# Patient Record
Sex: Female | Born: 1980 | Hispanic: Yes | Marital: Married | Smoking: Never smoker
Health system: Southern US, Community
[De-identification: ages and names within clinical notes are randomized; demographics above are authoritative.]

---

## 2006-07-14 ENCOUNTER — Inpatient Hospital Stay (HOSPITAL_COMMUNITY): Admission: AD | Admit: 2006-07-14 | Discharge: 2006-07-14 | Payer: Self-pay | Admitting: Obstetrics

## 2006-09-20 ENCOUNTER — Inpatient Hospital Stay (HOSPITAL_COMMUNITY): Admission: AD | Admit: 2006-09-20 | Discharge: 2006-09-23 | Payer: Self-pay | Admitting: Obstetrics

## 2008-02-21 IMAGING — US US OB LIMITED
1 series · 14 of 15 positions shown · non-contrast
Comparison: none

OBSTETRICAL ULTRASOUND:

 This ultrasound exam was performed in the [HOSPITAL] Ultrasound Department.  The OB US report was generated in the AS system, and faxed to the ordering physician.  This report is also available in [REDACTED] PACS.

[Series 1: us ob limited · 0.33mm/px · 14 of 15 slices shown]
[im 1/15]
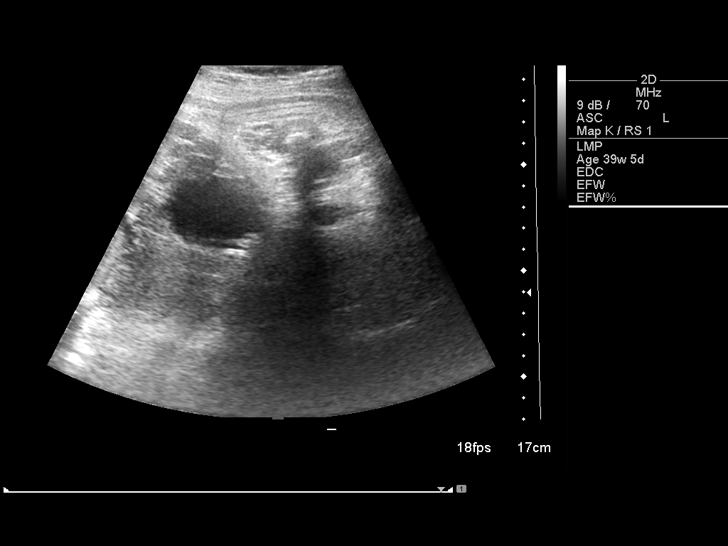
[im 2/15]
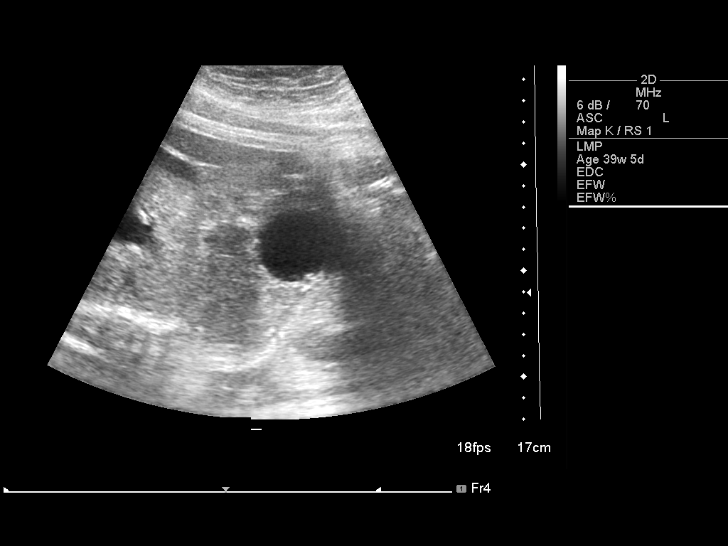
[im 3/15]
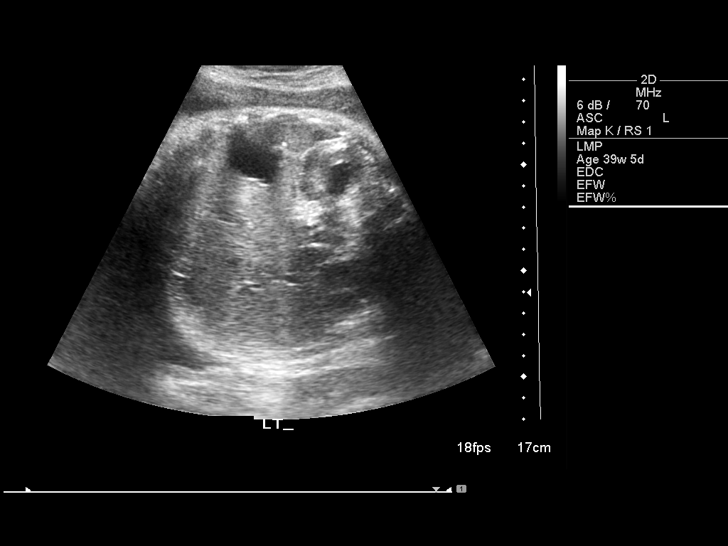
[im 4/15]
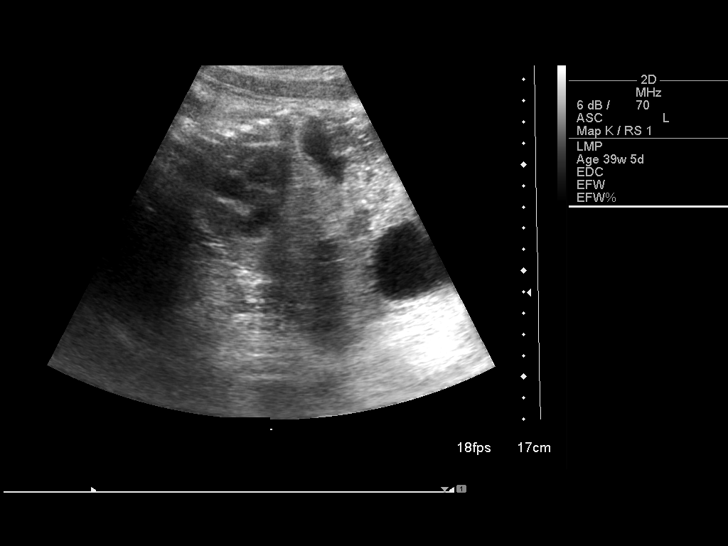
[im 5/15]
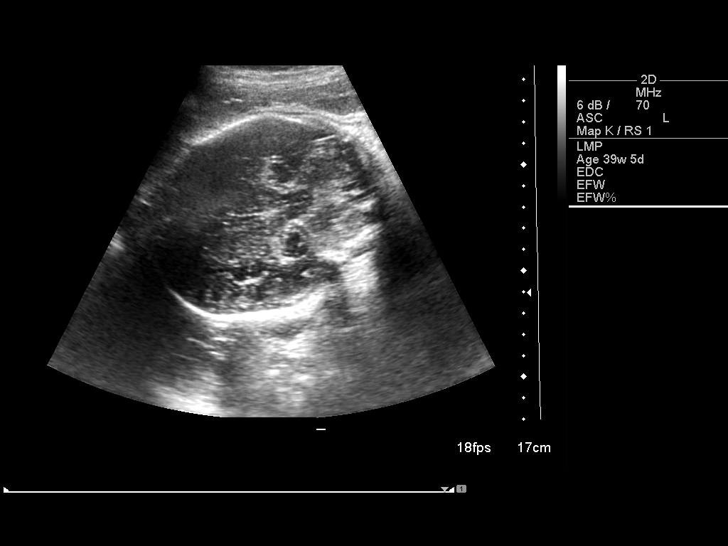
[im 6/15]
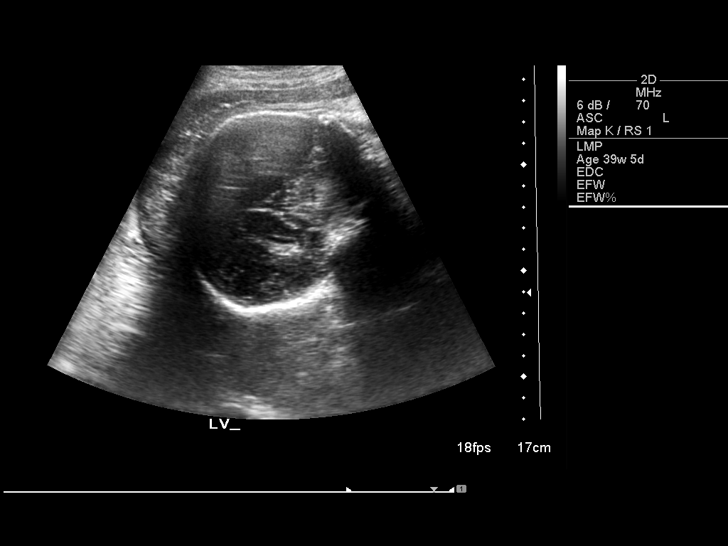
[im 7/15]
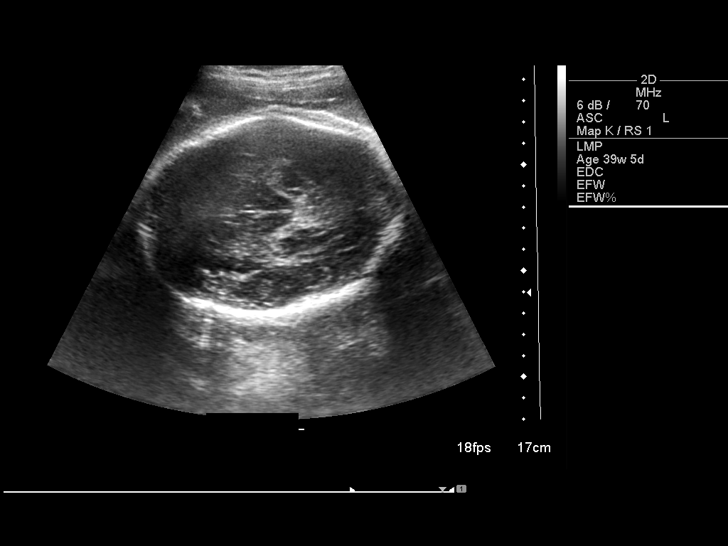
[im 9/15]
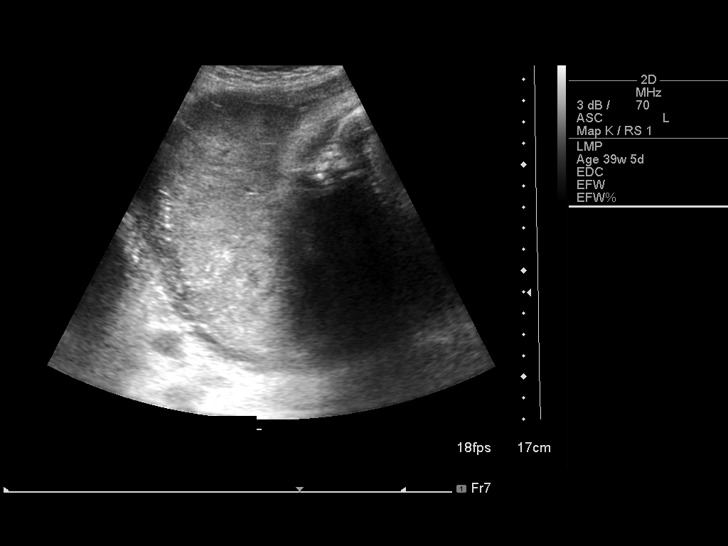
[im 10/15]
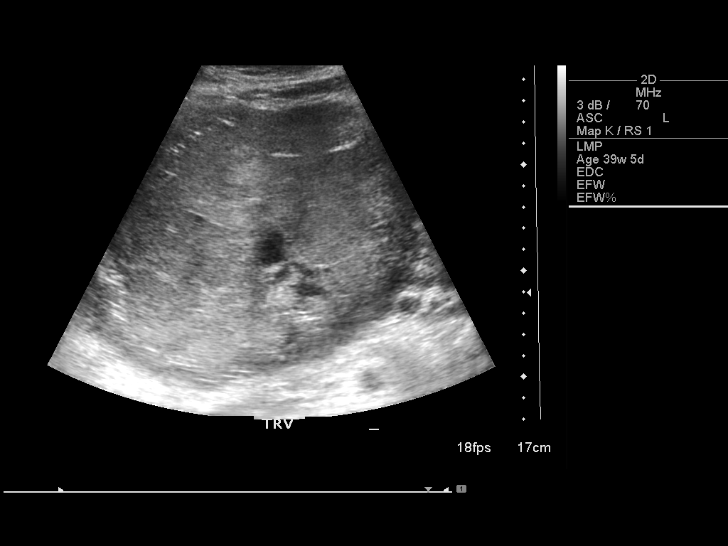
[im 11/15]
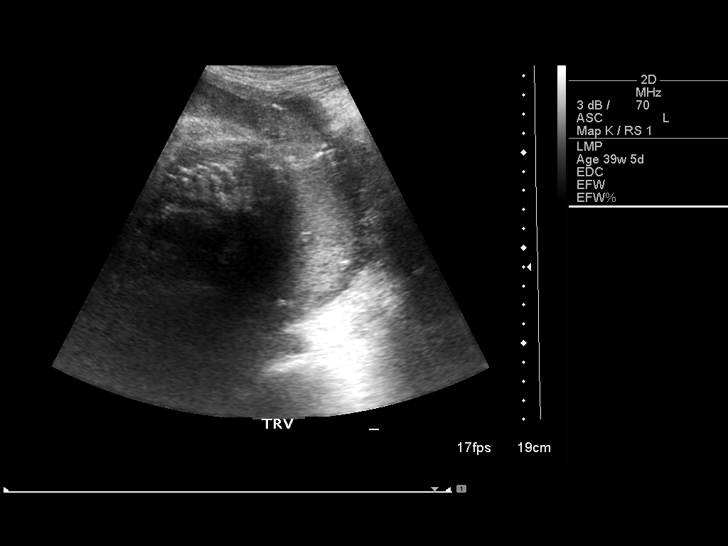
[im 12/15]
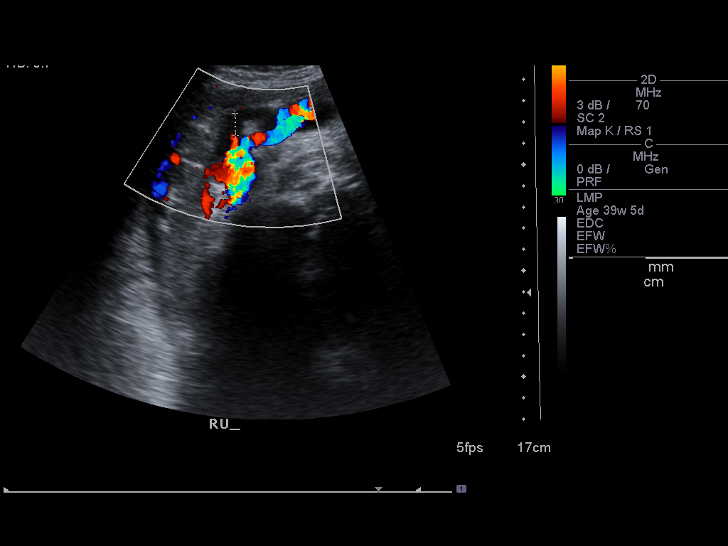
[im 13/15]
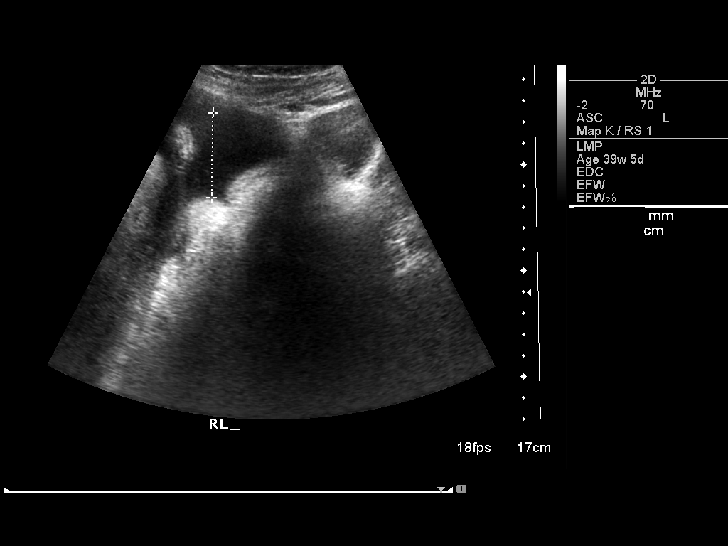
[im 14/15]
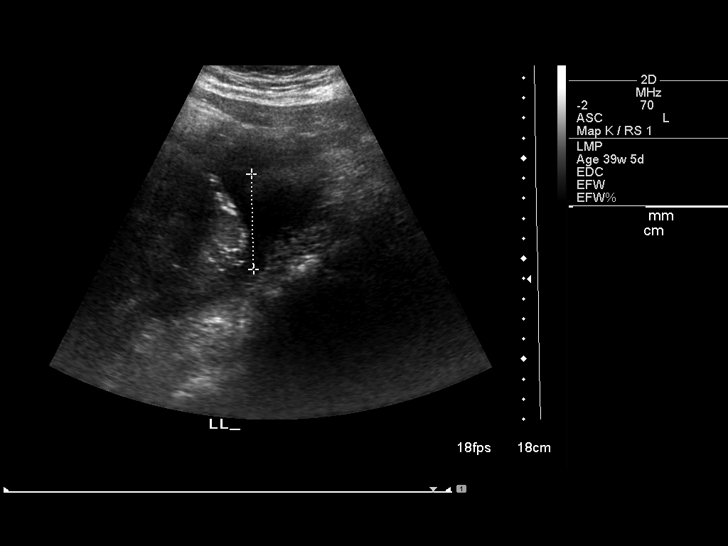
[im 15/15]
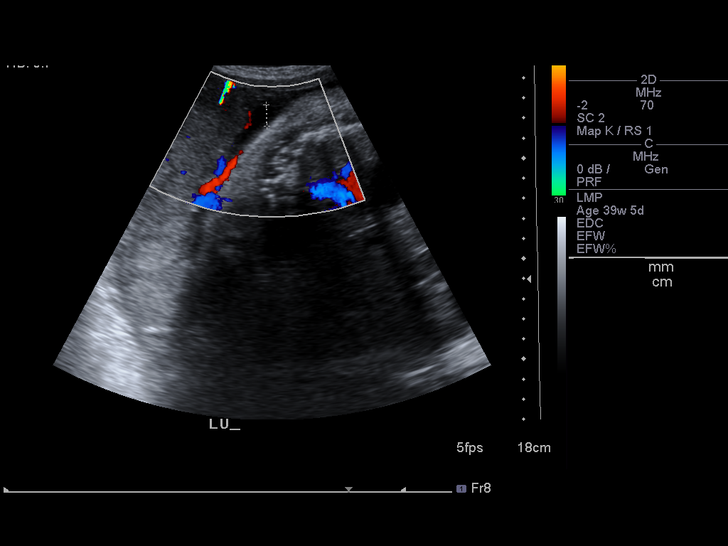

[14 of 15 positions shown; findings below may reference images not displayed]

IMPRESSION: See AS Obstetric US report.

## 2008-09-11 ENCOUNTER — Encounter: Payer: Self-pay | Admitting: Family Medicine

## 2008-09-11 ENCOUNTER — Ambulatory Visit (HOSPITAL_COMMUNITY): Admission: RE | Admit: 2008-09-11 | Discharge: 2008-09-11 | Payer: Self-pay | Admitting: Family Medicine

## 2008-12-23 ENCOUNTER — Ambulatory Visit: Payer: Self-pay | Admitting: Family Medicine

## 2008-12-28 ENCOUNTER — Inpatient Hospital Stay (HOSPITAL_COMMUNITY): Admission: AD | Admit: 2008-12-28 | Discharge: 2008-12-30 | Payer: Self-pay | Admitting: Obstetrics and Gynecology

## 2008-12-28 ENCOUNTER — Ambulatory Visit: Payer: Self-pay | Admitting: Family Medicine

## 2010-03-06 ENCOUNTER — Inpatient Hospital Stay (HOSPITAL_COMMUNITY): Payer: Self-pay

## 2010-03-06 ENCOUNTER — Inpatient Hospital Stay (HOSPITAL_COMMUNITY)
Admission: AD | Admit: 2010-03-06 | Discharge: 2010-03-06 | Disposition: A | Discharge status: Home / Self Care | Payer: Self-pay | Source: Ambulatory Visit | Attending: Obstetrics and Gynecology | Admitting: Obstetrics and Gynecology

## 2010-03-06 DIAGNOSIS — O209 Hemorrhage in early pregnancy, unspecified: Secondary | ICD-10-CM | POA: Insufficient documentation

## 2010-03-06 LAB — CBC
Hemoglobin: 13.8 g/dL (ref 12.0–15.0)
MCHC: 34.3 g/dL (ref 30.0–36.0)
RDW: 12.8 % (ref 11.5–15.5)

## 2010-03-06 LAB — URINALYSIS, ROUTINE W REFLEX MICROSCOPIC
Glucose, UA: NEGATIVE mg/dL
Ketones, ur: NEGATIVE mg/dL
Leukocytes, UA: NEGATIVE
pH: 7.5 (ref 5.0–8.0)

## 2010-03-06 LAB — URINE MICROSCOPIC-ADD ON

## 2010-03-06 LAB — WET PREP, GENITAL
Trich, Wet Prep: NONE SEEN
Yeast Wet Prep HPF POC: NONE SEEN

## 2010-03-07 LAB — RH IMMUNE GLOBULIN WORKUP (NOT WOMEN'S HOSP)

## 2010-03-08 ENCOUNTER — Inpatient Hospital Stay (HOSPITAL_COMMUNITY)
Admission: AD | Admit: 2010-03-08 | Discharge: 2010-03-08 | Disposition: A | Discharge status: Home / Self Care | Payer: Self-pay | Source: Ambulatory Visit | Attending: Obstetrics & Gynecology | Admitting: Obstetrics & Gynecology

## 2010-03-08 DIAGNOSIS — O039 Complete or unspecified spontaneous abortion without complication: Secondary | ICD-10-CM

## 2010-03-16 ENCOUNTER — Other Ambulatory Visit: Payer: Self-pay | Admitting: Obstetrics and Gynecology

## 2010-03-16 ENCOUNTER — Encounter (INDEPENDENT_AMBULATORY_CARE_PROVIDER_SITE_OTHER): Payer: Self-pay | Admitting: Obstetrics and Gynecology

## 2010-03-16 ENCOUNTER — Encounter: Payer: Self-pay | Admitting: Obstetrics and Gynecology

## 2010-03-16 DIAGNOSIS — Z124 Encounter for screening for malignant neoplasm of cervix: Secondary | ICD-10-CM

## 2010-03-16 DIAGNOSIS — Z01419 Encounter for gynecological examination (general) (routine) without abnormal findings: Secondary | ICD-10-CM

## 2010-03-16 DIAGNOSIS — O039 Complete or unspecified spontaneous abortion without complication: Secondary | ICD-10-CM

## 2010-03-17 ENCOUNTER — Encounter: Payer: Self-pay | Admitting: Physician Assistant

## 2010-03-25 NOTE — Progress Notes (Signed)
NAME:  Lauren Wong, Lauren Wong NO.:  [id number]  MEDICAL RECORD NO.:  [id number]           PATIENT TYPE:  A  LOCATION:  [facility]                   FACILITY:  [facility]  PHYSICIAN:  Argentina Donovan, MD        DATE OF BIRTH:  [birth year]  DATE OF SERVICE:  03/16/2010                                 CLINIC NOTE  REASON FOR VISIT:  Followup of miscarriage from the MAU.  The patient's age is 30.  Temperature 98.4, pulse 77, blood pressure 105/65, weight 129.6 pounds.  ALLERGIES:  No known drug allergies.  Last Pap smear was 3 years ago.  CURRENT MEDICATIONS:  None.  PAST MEDICAL HISTORY:  The patient's LMP was February 11, 2010.  The patient started menstruating at age of 22.  The patient is not trying to get pregnant at this time.  The patient has never had an abnormal Pap smear.  FAMILY HISTORY:  No significant family history.  SOCIAL HISTORY:  Lives with her spouse and two children.  Her last child was born 1 year and 3 months ago.  She does not work.  She does not drink.  She does not use any drugs or tobacco.  REVIEW OF SYSTEMS:  Negative.  HISTORY OF PRESENT ILLNESS:  The patient was seen in the MAU on March 08, 2010, for follow up of a miscarriage.  Previously, she has been seen on March 06, 2010.  On March 06, 2010, her beta hCG was 11914.  On March 08, 2010, her beta hCG was 7480.  Today, she says that her bleeding has decreased and she denies cramping or pain.  She has not had sex since her visit to the MAU because they told her that she needed to wait 40 days.  The patient's last baby was 1 year and 3 months ago.  She is a G2, P2-0-1-2.  She does want more children, but she wants them in 2-3 years.  Before this pregnancy, she had been Yasmin, which she likes, but she was forgetting to take.  She sees the doctor in [place], who had been writing her prescriptions.  She had Depo once, but did not like it due to weight gain.  She has been told previously about the  IUD and is interested in having that placed.  Today, she does not want the Depo shot, but she does agree to taking birth control pills while waiting for the Mirena.  The patient understands that she has to take her birth control pills every day in order to prevent a pregnancy and that we recommend that she wait to try to get pregnant until her beta hCG is gone down to 0.  PHYSICAL EXAMINATION:  GENERAL/VITAL SIGNS:  The patient in general in no acute distress and vital signs are reviewed. GYNECOLOGIC:  External exam was completely normal with no lesions. Internal exam; vagina normal, no discharge seen.  Cervix was open with blood at the opening without any cervical discharge or friability.  A Pap smear was collected.  ASSESSMENT:  This is a 30 year old status post complete abortion who is here for follow up.  PLAN: 1. Beta hCG repeat today, would like to follow  this to 0.  The patient     will be inform if she needs to come back for repeat beta hCG. 2. Contraception, discussed pros and cons of Mirena versus Depo-     Provera versus pills.  The patient agrees to take pills today.  She     understands the risk of getting pregnant and understands that she     needs to be compliant with her pills until she is able to get the     Mirena.  The patient will follow up for Mirena insertion or as     needed.    ______________________________ Ellery Plunk, MD   ______________________________ Argentina Donovan, MD   RS/MEDQ  D:  03/16/2010  T:  03/17/2010  Job:  [id number]

## 2010-03-30 ENCOUNTER — Other Ambulatory Visit: Payer: Self-pay

## 2010-04-03 DEATH — deceased

## 2010-04-04 LAB — RH IMMUNE GLOB WKUP(>/=20WKS)(NOT WOMEN'S HOSP): Fetal Screen: NEGATIVE

## 2010-04-04 LAB — CBC
HCT: 40.4 % (ref 36.0–46.0)
Hemoglobin: 13.8 g/dL (ref 12.0–15.0)
MCHC: 34.2 g/dL (ref 30.0–36.0)
MCV: 96.3 fL (ref 78.0–100.0)
Platelets: 201 K/uL (ref 150–400)
RBC: 4.2 MIL/uL (ref 3.87–5.11)
RDW: 13.2 % (ref 11.5–15.5)
WBC: 7.9 K/uL (ref 4.0–10.5)

## 2010-05-05 ENCOUNTER — Ambulatory Visit: Payer: Self-pay | Admitting: Physician Assistant

## 2010-05-17 NOTE — Op Note (Signed)
Lauren Wong, RAMAKRISHNAN NO.:  [id number]   MEDICAL RECORD NO.:  [id number]          PATIENT TYPE:  INP   LOCATION:  9198                          [facility]ILITY[facility]  [facility]   PHYSICIAN:  Roseanna Rainbow, M.D.DATE OF BIRTH:  06/01/1980   DATE OF PROCEDURE:  09/20/2006  DATE OF DISCHARGE:                               OPERATIVE REPORT   PREOPERATIVE DIAGNOSIS:  Intrauterine pregnancy at term, complete breech  presentation.   POSTOPERATIVE DIAGNOSIS:  Intrauterine pregnancy at term, complete  breech presentation.   PROCEDURE:  Primary low-uterine flap elliptical cesarean delivery via  Pfannenstiel skin incision.   SURGEON:  Quinn Plowman, M.D.   ANESTHESIA:  Spinal.   ESTIMATED BLOOD LOSS:  800 mL.   URINE OUTPUT:  300 mL.   IV FLUIDS:  2800 mL   DESCRIPTION OF PROCEDURE:  The patient was taken to the operating room  with an IV running.  She was placed in the dorsal supine position with a  leftward tilt and prepped and draped in the usual sterile fashion.  After a time-out had been completed a Pfannenstiel skin incision was  then made with the scalpel and carried down to the underlying fascia  with the Bovie.  The fascia was nicked in the midline.  The fascial  incision was then extended bilaterally with curved Mayo scissors.   The superior aspect of the fascial incision was then tented up, and the  underlying rectus muscle was dissected off.  The inferior aspect of the  fascial incision was manipulated in a similar fashion.  The rectus  muscles were separated in the midline.  The parietal peritoneum was  entered bluntly.  This incision was then extended superiorly and  inferiorly with good visualization of the bladder.  An Alexis retractor  was then placed into the incision.  The vesicouterine peritoneum was  tented up and entered sharply.  This incision was then extended  bilaterally, and the bladder flap created bluntly.  The lower uterine  segment was then incised in a transverse fashion with the scalpel.  The  incision was extended bluntly.  A complete breech extraction was then  performed.  The head was delivered at atraumatically.  The oropharynx  was suctioned with bulb suction.  The cord was clamped and cut.  The  infant was handed off to the awaiting neonatologist.  She was delivered  of a live born female.  Apgars were 9 at one and five minutes  respectively.  The weight was 8 pounds 12 ounces.   The placenta was then removed.  The intrauterine cavity was evacuated of  any remaining amniotic fluid, and clots and debris with a moistened  laparotomy sponge.  The uterine incision was then reapproximated in a  running interlocking fashion using suture of #0 Monocryl.  A second  imbricating layer of the same suture was then placed.  Individual  bleeding points were sutured with figure-of-eight sutures of 2-0  Monocryl.  The paracolic gutters were then irrigated.  The  parietoperitoneum was then reapproximated in a running fashion using 2-0  Vicryl.  The fascia  was closed in a running fashion  using #0 Vicryl.  The skin was closed  with staples.  At the close of the procedure the instrument and pack  counts were said to be correct x2.  A gram of cephazolin had been given  at cord clamp.  The patient was taken to the PACU awake, and in stable  condition.      Roseanna Rainbow, M.D.  Electronically Signed     LAJ/MEDQ  D:  09/20/2006  T:  09/21/2006  Job:  [id number]

## 2010-05-20 NOTE — Discharge Summary (Signed)
NAMEKETINA, MARS NO.:  [id number]   MEDICAL RECORD NO.:  [id number]          PATIENT TYPE:  INP   LOCATION:  9103                          [facility]   PHYSICIAN:  Kathreen Cosier, M.D.DATE OF BIRTH:  [birth year]   DATE OF ADMISSION:  09/20/2006  DATE OF DISCHARGE:  09/23/2006                               DISCHARGE SUMMARY   The patient is a 30 year old gravida 1, EDC September 22 was admitted in  labor with a breech presentation, and she underwent primary low  transverse cesarean section for breech presentation.  Postoperatively,  she did well.  Her hemoglobin on admission was 11.5, postoperative 10.9,  platelets 199.  Sodium 135, potassium 4.3, chloride 106, CO2 25.  She  was discharged home on the third postoperative day to see me in 6 weeks.   DISCHARGE DIAGNOSIS:  Status post primary low transverse cesarean  section at term for breech presentation.           ______________________________  Kathreen Cosier, M.D.     BAM/MEDQ  D:  10/17/2006  T:  10/17/2006  Job:  [id number]

## 2010-10-13 LAB — RH IMMUNE GLOB WKUP(>/=20WKS)(NOT WOMEN'S HOSP)

## 2010-10-13 LAB — COMPREHENSIVE METABOLIC PANEL
ALT: 35
AST: 37
Albumin: 2.2 — ABNORMAL LOW
CO2: 25
Calcium: 9
GFR calc Af Amer: >60
GFR calc non Af Amer: >60
Sodium: 135

## 2010-10-13 LAB — CBC
HCT: 32.2 — ABNORMAL LOW
Hemoglobin: 10.9 — ABNORMAL LOW
Hemoglobin: 11.5 — ABNORMAL LOW
MCHC: 33.7
MCV: 85.5
RBC: 3.76 — ABNORMAL LOW
RBC: 3.94
RDW: 15 — ABNORMAL HIGH

## 2010-10-13 LAB — RPR: RPR Ser Ql: NONREACTIVE

## 2010-10-18 LAB — RH IMMUNE GLOBULIN WORKUP (NOT WOMEN'S HOSP): ABO/RH(D): O NEG

## 2011-08-07 IMAGING — US US OB TRANSVAGINAL
1 series · 14 of 28 positions shown · non-contrast
Comparison: None.

CLINICAL DATA: Positive pregnancy test with vaginal bleeding.
Gestational age by LMP is 7 weeks 5 days.

OBSTETRIC <14 WK US AND TRANSVAGINAL OB US
TECHNIQUE: Both transabdominal and transvaginal ultrasound
examinations were performed for complete evaluation of the
gestation as well as the maternal uterus, adnexal regions, and
pelvic cul-de-sac.  Transvaginal technique was performed to assess
early pregnancy.

[Series 1: us ob comp less 14 wks · 44 acquisitions, 14 frames shown]
[im 2/44]
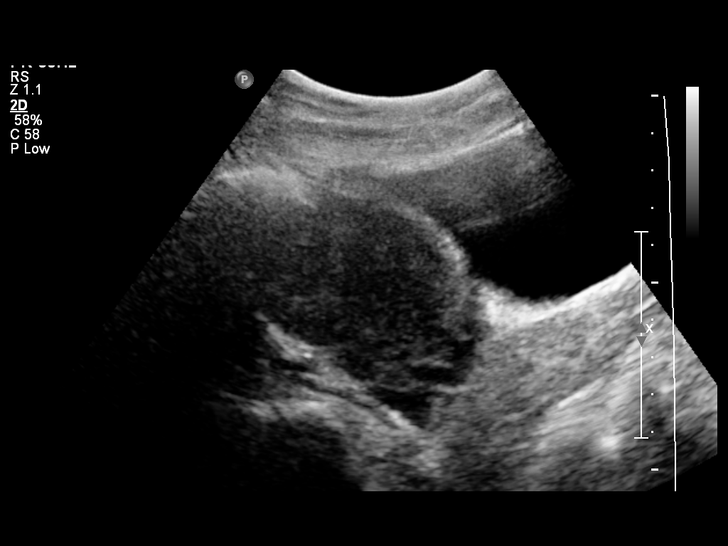
[im 5/44]
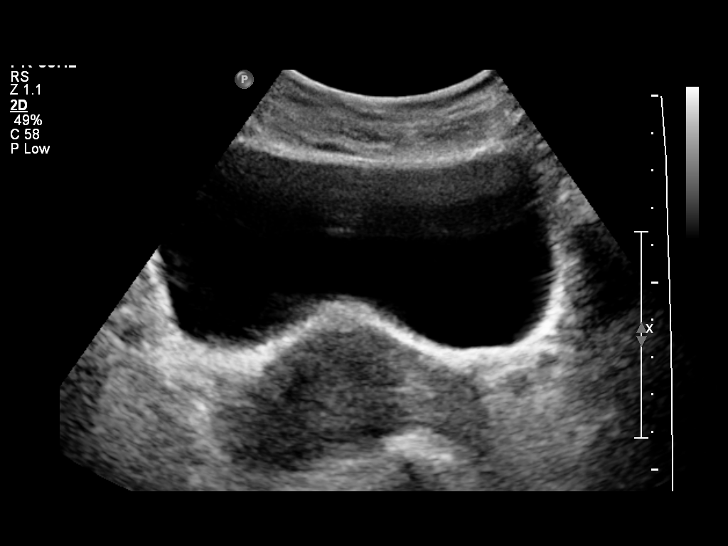
[im 8/44]
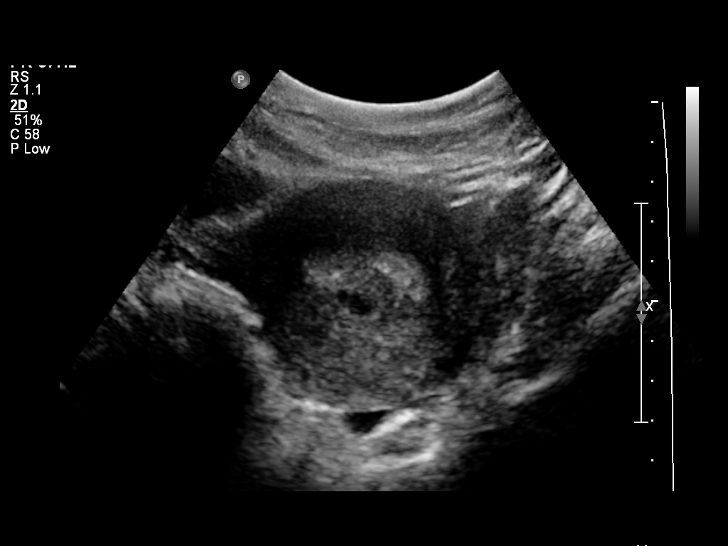
[im 12/44]
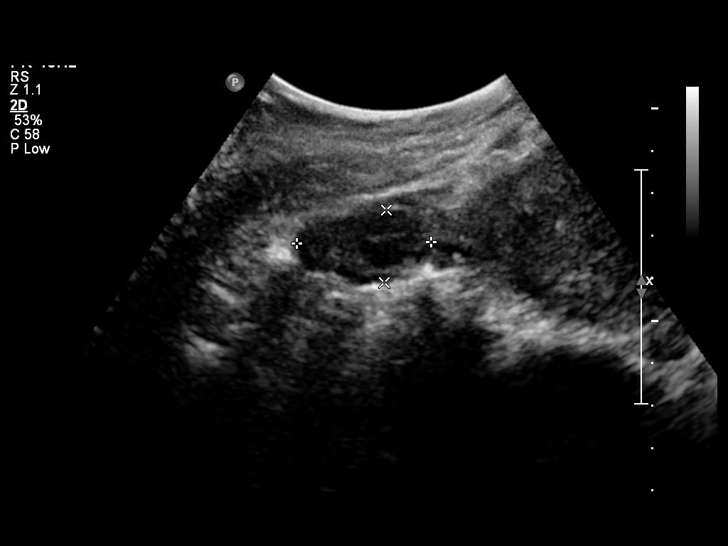
[im 15/44]
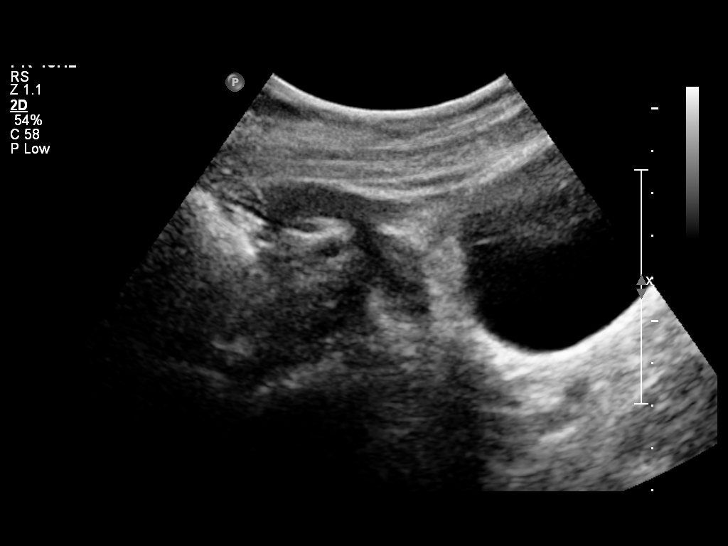
[im 18/44]
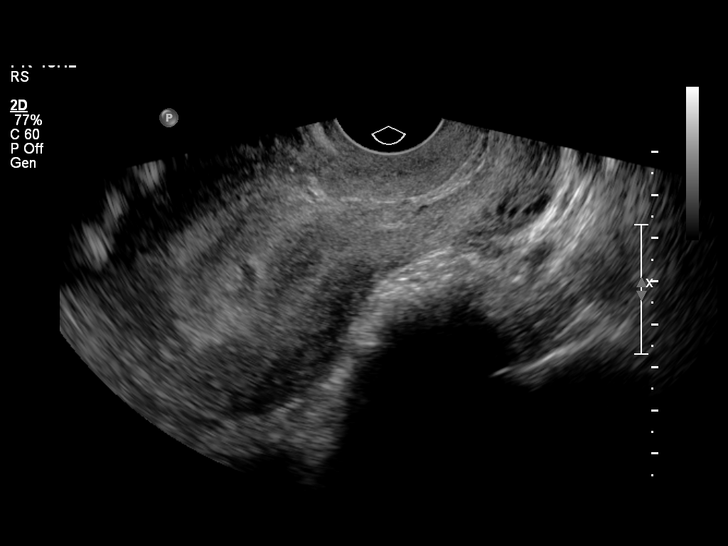
[im 21/44]
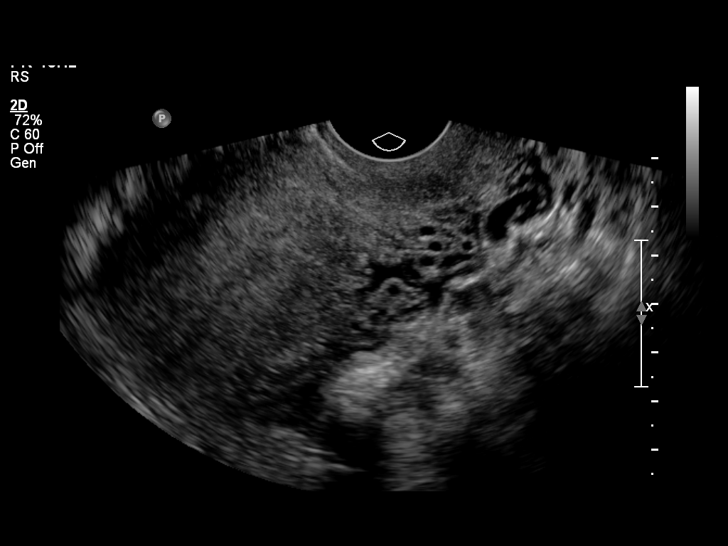
[im 24/44]
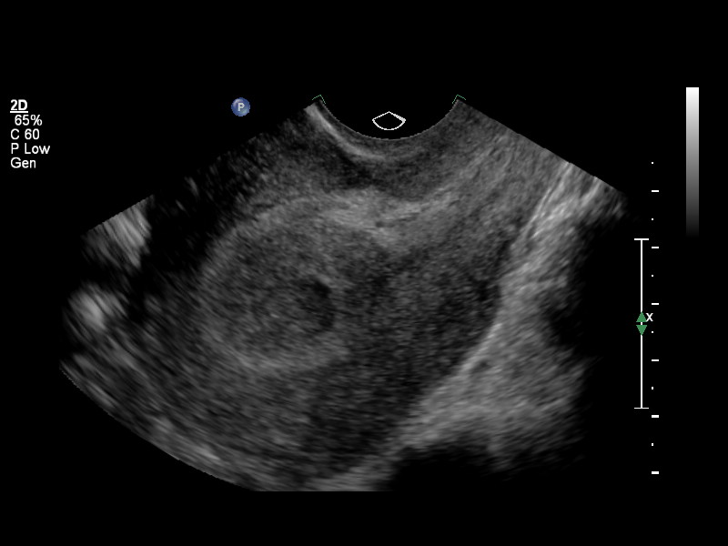
[im 28/44]
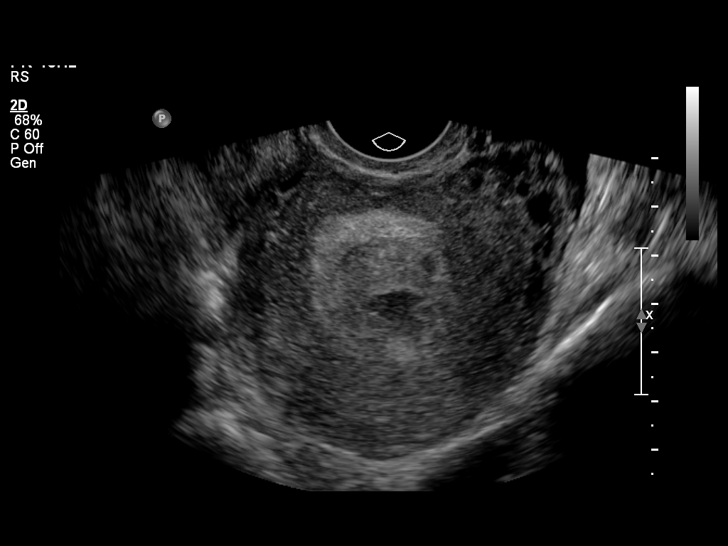
[im 31/44]
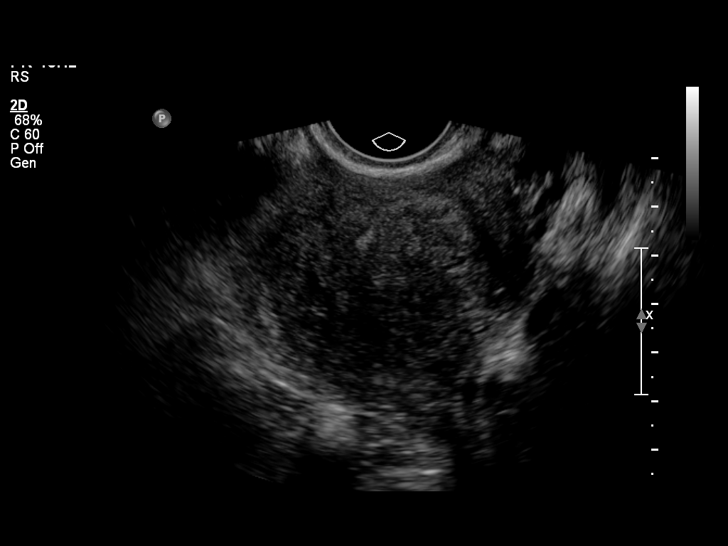
[im 34/44]
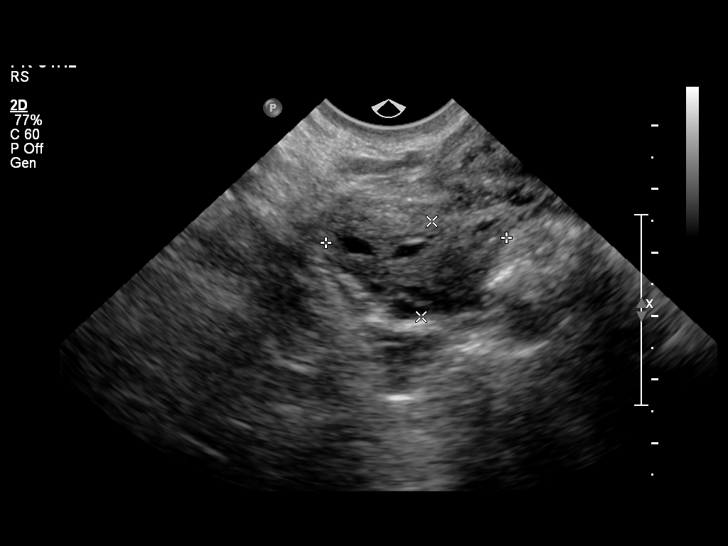
[im 37/44]
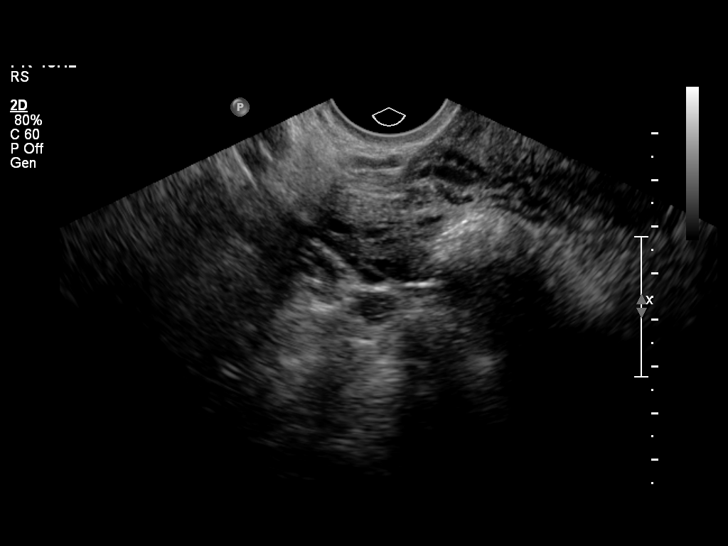
[im 40/44]
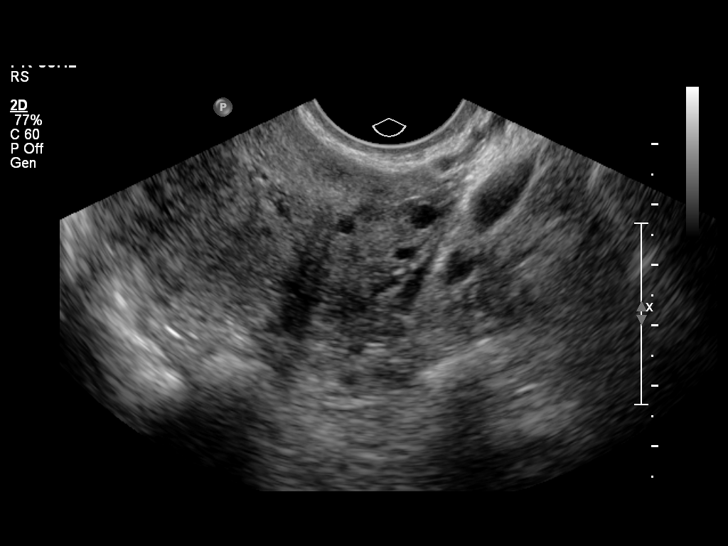
[im 44/44]
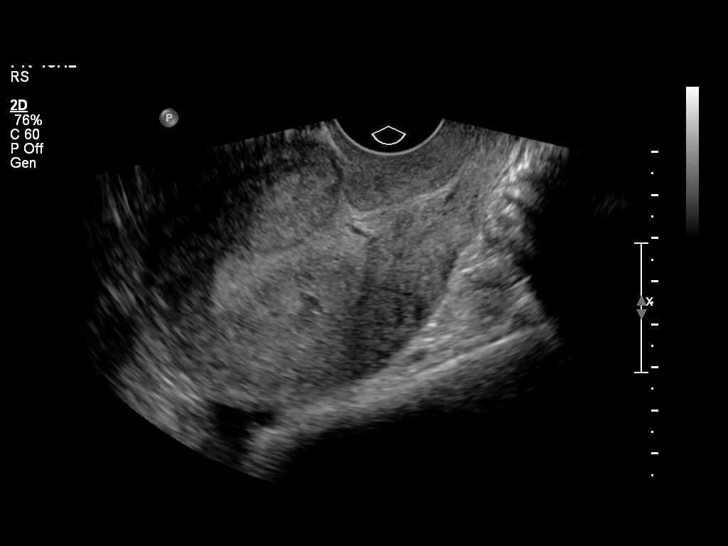

[14 of 28 positions shown; findings below may reference images not displayed]

No intrauterine gestational sac is identified.  The endometrial
canal is filled with heterogeneous fluid/debris.

The right ovary measures 2.8 x 1.5 x 1.6 cm.  The maternal left
ovary measures 3.2 x 1.4 x 2.3 cm.  Both ovaries are
sonographically normal.

A tiny amount of simple appearing free fluid is visible in the cul-
de-sac.
IMPRESSION: No intrauterine gestational sac in this patient with a positive
pregnancy test.  Differential considerations include intrauterine
gestation too early to visualize, completed abortion, and non-
visualized ectopic pregnancy.  Close follow-up is recommended.

Heterogeneous fluid/debris in the endometrial cavity.  This may
represent blood products.

## 2012-01-03 NOTE — L&D Delivery Note (Signed)
   Delivery Note At 11:58 AM a viable female was delivered via VBAC, Spontaneous (Presentation: Left Occiput Anterior).  APGAR: 7, 8; weight 9 lb 15 oz (4508 g).   Placenta status: Intact, Spontaneous.  Cord: 3 vessels.  Cord pH: 7.21  Anesthesia: Epidural  Episiotomy: None Lacerations: None Suture Repair: na Est. Blood Loss (mL): 350  Mom to postpartum.  Baby to Couplet care / Skin to Skin.  Pt progressed quickly to complete and pushed with good maternal effort to crowning.  Head delivered quickly and immediately a shoulder dystocia was identified. McRoberts was performed and suprapubic initially without relief. The posterior arm was grasped and attempted to bring forward but was caught on the belly.  Wood's screw was then attempted at the same time as suprapubic was administered and the anterior shoulder delivered (right arm).  The baby then delivered completely and had floppy tone but spontaneous cry. Cord was clamped and cut and baby was placed in the warmer for resuscitation. NICU came to evaluate.  Cord gas collected and resulted at pH of 7.21. Placenta  delivered spontaneously intact with 3V cord.  No tears or lacerations. Baby was noted to be moving both arms and was then placed on Mom's chest for skin to skin.  Mom and baby doing well.  Mom to postpartum and baby to remain with Mom.   Clarise Chacko L 12/02/2012, 12:55 PM

## 2012-06-17 LAB — OB RESULTS CONSOLE ABO/RH: RH Type: NEGATIVE

## 2012-06-17 LAB — OB RESULTS CONSOLE GC/CHLAMYDIA
Chlamydia: NEGATIVE
Gonorrhea: NEGATIVE

## 2012-06-17 LAB — OB RESULTS CONSOLE RUBELLA ANTIBODY, IGM: Rubella: IMMUNE

## 2012-06-17 LAB — OB RESULTS CONSOLE HEPATITIS B SURFACE ANTIGEN: Hepatitis B Surface Ag: NEGATIVE

## 2012-11-07 LAB — OB RESULTS CONSOLE GBS: GBS: POSITIVE

## 2012-11-26 ENCOUNTER — Inpatient Hospital Stay (HOSPITAL_COMMUNITY)
Admission: AD | Admit: 2012-11-26 | Discharge: 2012-11-26 | Disposition: A | Discharge status: Home / Self Care | Payer: Self-pay | Source: Ambulatory Visit | Attending: Family Medicine | Admitting: Family Medicine

## 2012-11-26 ENCOUNTER — Encounter (HOSPITAL_COMMUNITY): Payer: Self-pay | Admitting: *Deleted

## 2012-11-26 DIAGNOSIS — O368190 Decreased fetal movements, unspecified trimester, not applicable or unspecified: Secondary | ICD-10-CM | POA: Insufficient documentation

## 2012-11-26 DIAGNOSIS — O479 False labor, unspecified: Secondary | ICD-10-CM | POA: Insufficient documentation

## 2012-11-26 NOTE — MAU Note (Signed)
Pt reports contractions and decreased fetal movement. No fetal movement today.

## 2012-12-02 ENCOUNTER — Encounter (HOSPITAL_COMMUNITY): Payer: Self-pay | Admitting: *Deleted

## 2012-12-02 ENCOUNTER — Encounter (HOSPITAL_COMMUNITY): Payer: Medicaid Other | Admitting: Anesthesiology

## 2012-12-02 ENCOUNTER — Encounter (HOSPITAL_COMMUNITY): Payer: Self-pay | Admitting: Anesthesiology

## 2012-12-02 ENCOUNTER — Inpatient Hospital Stay (HOSPITAL_COMMUNITY)
Admission: AD | Admit: 2012-12-02 | Discharge: 2012-12-04 | DRG: 775 | Disposition: A | Discharge status: Home / Self Care | Payer: Medicaid Other | Source: Ambulatory Visit | Attending: Obstetrics & Gynecology | Admitting: Obstetrics & Gynecology

## 2012-12-02 DIAGNOSIS — O9989 Other specified diseases and conditions complicating pregnancy, childbirth and the puerperium: Secondary | ICD-10-CM

## 2012-12-02 DIAGNOSIS — Z22330 Carrier of Group B streptococcus: Secondary | ICD-10-CM

## 2012-12-02 DIAGNOSIS — O34219 Maternal care for unspecified type scar from previous cesarean delivery: Secondary | ICD-10-CM | POA: Diagnosis present

## 2012-12-02 DIAGNOSIS — O99892 Other specified diseases and conditions complicating childbirth: Secondary | ICD-10-CM | POA: Diagnosis present

## 2012-12-02 DIAGNOSIS — Z3490 Encounter for supervision of normal pregnancy, unspecified, unspecified trimester: Secondary | ICD-10-CM

## 2012-12-02 DIAGNOSIS — O4290 Premature rupture of membranes, unspecified as to length of time between rupture and onset of labor, unspecified weeks of gestation: Secondary | ICD-10-CM | POA: Diagnosis present

## 2012-12-02 DIAGNOSIS — O360990 Maternal care for other rhesus isoimmunization, unspecified trimester, not applicable or unspecified: Secondary | ICD-10-CM | POA: Diagnosis present

## 2012-12-02 LAB — CBC
Hemoglobin: 12.2 g/dL (ref 12.0–15.0)
MCH: 29.4 pg (ref 26.0–34.0)
MCHC: 33.5 g/dL (ref 30.0–36.0)
Platelets: 168 10*3/uL (ref 150–400)
RDW: 16 % — ABNORMAL HIGH (ref 11.5–15.5)

## 2012-12-02 LAB — RPR: RPR Ser Ql: NONREACTIVE

## 2012-12-02 LAB — TYPE AND SCREEN
ABO/RH(D): O NEG
Antibody Screen: NEGATIVE

## 2012-12-02 MED ORDER — WITCH HAZEL-GLYCERIN EX PADS: 1.0000 "application " | MEDICATED_PAD | CUTANEOUS | Status: DC | PRN

## 2012-12-02 MED ORDER — CITRIC ACID-SODIUM CITRATE 334-500 MG/5ML PO SOLN: 30.0000 mL | ORAL | Status: DC | PRN

## 2012-12-02 MED ORDER — LACTATED RINGERS IV SOLN: 500.0000 mL | INTRAVENOUS | Status: DC | PRN

## 2012-12-02 MED ORDER — EPHEDRINE 5 MG/ML INJ
10.0000 mg | INTRAVENOUS | Status: DC | PRN
  Filled 2012-12-02: qty 4
  Filled 2012-12-02: qty 2

## 2012-12-02 MED ORDER — ONDANSETRON HCL 4 MG PO TABS: 4.0000 mg | ORAL_TABLET | ORAL | Status: DC | PRN

## 2012-12-02 MED ORDER — OXYTOCIN 40 UNITS IN LACTATED RINGERS INFUSION - SIMPLE MED
62.5000 mL/h | INTRAVENOUS | Status: DC
  Administered 2012-12-02: 999 mL/h via INTRAVENOUS
  Filled 2012-12-02: qty 1000

## 2012-12-02 MED ORDER — OXYCODONE-ACETAMINOPHEN 5-325 MG PO TABS
1.0000 | ORAL_TABLET | ORAL | Status: DC | PRN
  Administered 2012-12-03 – 2012-12-04 (×5): 1 via ORAL
  Filled 2012-12-02 (×5): qty 1

## 2012-12-02 MED ORDER — ZOLPIDEM TARTRATE 5 MG PO TABS: 5.0000 mg | ORAL_TABLET | Freq: Every evening | ORAL | Status: DC | PRN

## 2012-12-02 MED ORDER — IBUPROFEN 600 MG PO TABS: 600.0000 mg | ORAL_TABLET | Freq: Four times a day (QID) | ORAL | Status: DC | PRN

## 2012-12-02 MED ORDER — LACTATED RINGERS IV SOLN: 500.0000 mL | Freq: Once | INTRAVENOUS | Status: DC

## 2012-12-02 MED ORDER — FENTANYL CITRATE 0.05 MG/ML IJ SOLN
100.0000 ug | INTRAMUSCULAR | Status: DC | PRN
  Administered 2012-12-02: 100 ug via INTRAVENOUS
  Filled 2012-12-02: qty 2

## 2012-12-02 MED ORDER — DIBUCAINE 1 % RE OINT
1.0000 "application " | TOPICAL_OINTMENT | RECTAL | Status: DC | PRN
  Filled 2012-12-02: qty 28

## 2012-12-02 MED ORDER — ACETAMINOPHEN 325 MG PO TABS: 650.0000 mg | ORAL_TABLET | ORAL | Status: DC | PRN

## 2012-12-02 MED ORDER — FENTANYL 2.5 MCG/ML BUPIVACAINE 1/10 % EPIDURAL INFUSION (WH - ANES)
14.0000 mL/h | INTRAMUSCULAR | Status: DC | PRN
  Administered 2012-12-02: 14 mL/h via EPIDURAL
  Filled 2012-12-02: qty 125

## 2012-12-02 MED ORDER — LACTATED RINGERS IV SOLN
INTRAVENOUS | Status: DC
  Administered 2012-12-02: 06:00:00 via INTRAVENOUS

## 2012-12-02 MED ORDER — DIPHENHYDRAMINE HCL 50 MG/ML IJ SOLN: 12.5000 mg | INTRAMUSCULAR | Status: DC | PRN

## 2012-12-02 MED ORDER — SIMETHICONE 80 MG PO CHEW: 80.0000 mg | CHEWABLE_TABLET | ORAL | Status: DC | PRN

## 2012-12-02 MED ORDER — PRENATAL MULTIVITAMIN CH
1.0000 | ORAL_TABLET | Freq: Every day | ORAL | Status: DC
  Administered 2012-12-03: 1 via ORAL
  Filled 2012-12-02: qty 1

## 2012-12-02 MED ORDER — OXYCODONE-ACETAMINOPHEN 5-325 MG PO TABS: 1.0000 | ORAL_TABLET | ORAL | Status: DC | PRN

## 2012-12-02 MED ORDER — DEXTROSE 5 % IV SOLN
5.0000 10*6.[IU] | Freq: Once | INTRAVENOUS | Status: AC
  Administered 2012-12-02: 5 10*6.[IU] via INTRAVENOUS
  Filled 2012-12-02: qty 5

## 2012-12-02 MED ORDER — PHENYLEPHRINE 40 MCG/ML (10ML) SYRINGE FOR IV PUSH (FOR BLOOD PRESSURE SUPPORT)
80.0000 ug | PREFILLED_SYRINGE | INTRAVENOUS | Status: DC | PRN
  Filled 2012-12-02: qty 2

## 2012-12-02 MED ORDER — EPHEDRINE 5 MG/ML INJ
10.0000 mg | INTRAVENOUS | Status: DC | PRN
  Filled 2012-12-02: qty 2

## 2012-12-02 MED ORDER — ONDANSETRON HCL 4 MG/2ML IJ SOLN: 4.0000 mg | INTRAMUSCULAR | Status: DC | PRN

## 2012-12-02 MED ORDER — OXYTOCIN BOLUS FROM INFUSION: 500.0000 mL | INTRAVENOUS | Status: DC

## 2012-12-02 MED ORDER — BENZOCAINE-MENTHOL 20-0.5 % EX AERO: 1.0000 "application " | INHALATION_SPRAY | CUTANEOUS | Status: DC | PRN

## 2012-12-02 MED ORDER — PHENYLEPHRINE 40 MCG/ML (10ML) SYRINGE FOR IV PUSH (FOR BLOOD PRESSURE SUPPORT)
80.0000 ug | PREFILLED_SYRINGE | INTRAVENOUS | Status: DC | PRN
  Filled 2012-12-02: qty 2
  Filled 2012-12-02: qty 10

## 2012-12-02 MED ORDER — TETANUS-DIPHTH-ACELL PERTUSSIS 5-2.5-18.5 LF-MCG/0.5 IM SUSP: 0.5000 mL | Freq: Once | INTRAMUSCULAR | Status: DC

## 2012-12-02 MED ORDER — IBUPROFEN 600 MG PO TABS
600.0000 mg | ORAL_TABLET | Freq: Four times a day (QID) | ORAL | Status: DC
  Administered 2012-12-02 – 2012-12-04 (×7): 600 mg via ORAL
  Filled 2012-12-02 (×7): qty 1

## 2012-12-02 MED ORDER — PENICILLIN G POTASSIUM 5000000 UNITS IJ SOLR
2.5000 10*6.[IU] | INTRAMUSCULAR | Status: DC
  Administered 2012-12-02: 2.5 10*6.[IU] via INTRAVENOUS
  Filled 2012-12-02 (×5): qty 2.5

## 2012-12-02 MED ORDER — MEASLES, MUMPS & RUBELLA VAC ~~LOC~~ INJ: 0.5000 mL | INJECTION | Freq: Once | SUBCUTANEOUS | Status: DC

## 2012-12-02 MED ORDER — DIPHENHYDRAMINE HCL 25 MG PO CAPS: 25.0000 mg | ORAL_CAPSULE | Freq: Four times a day (QID) | ORAL | Status: DC | PRN

## 2012-12-02 MED ORDER — LANOLIN HYDROUS EX OINT: TOPICAL_OINTMENT | CUTANEOUS | Status: DC | PRN

## 2012-12-02 MED ORDER — LIDOCAINE HCL (PF) 1 % IJ SOLN
30.0000 mL | INTRAMUSCULAR | Status: DC | PRN
  Filled 2012-12-02 (×2): qty 30

## 2012-12-02 MED ORDER — ONDANSETRON HCL 4 MG/2ML IJ SOLN: 4.0000 mg | Freq: Four times a day (QID) | INTRAMUSCULAR | Status: DC | PRN

## 2012-12-02 MED ORDER — SENNOSIDES-DOCUSATE SODIUM 8.6-50 MG PO TABS
2.0000 | ORAL_TABLET | ORAL | Status: DC
  Administered 2012-12-02 – 2012-12-03 (×2): 2 via ORAL
  Filled 2012-12-02 (×2): qty 2

## 2012-12-02 NOTE — MAU Note (Signed)
Pt reports ROM at 0330, contractions 

## 2012-12-02 NOTE — Plan of Care (Signed)
Problem: Consults Goal: Birthing Suites Patient Information Press F2 to bring up selections list  Outcome: Completed/Met Date Met:  12/02/12  Pt > [redacted] weeks EGA

## 2012-12-02 NOTE — Anesthesia Preprocedure Evaluation (Signed)

## 2012-12-02 NOTE — H&P (Signed)
Lauren Wong is a 32 y.o. female [id number] at 40.3wks presenting for SROM @ 0330 and reg ctx since. Denies bldg; reports +FM. Her preg has been followed by the [facility] and has been remarkable for 1) 1st preg C/S for breech 2) prev successful VBAC and plan VBAC for this preg (consent on chart) 3) GBS pos 4) Rh neg History OB History   Grav Para Term Preterm Abortions TAB SAB Ect Mult Living   4 2 2  1  1   2      History reviewed. No pertinent past medical history. Past Surgical History  Procedure Laterality Date  . Cesarean section     Family History: family history is not on file. Social History:  reports that she has never smoked. She has never used smokeless tobacco. She reports that she does not drink alcohol or use illicit drugs.   Prenatal Transfer Tool  Maternal Diabetes: No Genetic Screening: Normal Maternal Ultrasounds/Referrals: Normal Fetal Ultrasounds or other Referrals:  None Maternal Substance Abuse:  No Significant Maternal Medications:  None Significant Maternal Lab Results:  Lab values include: Group B Strep positive Other Comments:  None  ROS  Dilation: 6 Effacement (%): 90 Station: -1 Exam by:: A. Tuttle, RNC Blood pressure 121/83, pulse 61, temperature 97.9 F (36.6 C), temperature source Oral, resp. rate 18, height 5\' 1"  (1.549 m), weight 82.781 kg (182 lb 8 oz), SpO2 100.00%. Exam Physical Exam  Constitutional: She is oriented to person, place, and time. She appears well-developed.  HENT:  Head: Normocephalic.  Neck: Normal range of motion.  Cardiovascular: Normal rate.   Respiratory: Effort normal.  GI:  EFM 120-130, +accels, no decels Ctx q 2-4 mins  Musculoskeletal: Normal range of motion.  Neurological: She is alert and oriented to person, place, and time.  Skin: Skin is warm and dry.  Psychiatric: She has a normal mood and affect. Her behavior is normal. Thought content normal.    Prenatal labs: ABO, Rh: O/Negative/-- (06/16  0000) Antibody:   Rubella: Immune (06/16 0000) RPR: Nonreactive (06/16 0000)  HBsAg: Negative (06/16 0000)  HIV: Non-reactive (06/16 0000)  GBS: Positive (11/06 0000)   Assessment/Plan: IUP at 40.3wks SROM/labor  Admit to [facility] Expectant management PCN for GBS prophylaxis Plans on IV pain meds   Lauren Wong 12/02/2012, 7:20 AM

## 2012-12-02 NOTE — Progress Notes (Signed)
Lauren Wong is a 32 y.o. [id number] at [redacted]w[redacted]d admitted for active labor, rupture of membranes  Subjective:  Pt doing well. Sp epidural. +Fm. No pressure yet.   Objective: BP 115/68  Pulse 58  Temp(Src) 97.8 F (36.6 C) (Axillary)  Resp 16  Ht 5\' 1"  (1.549 m)  Wt 82.781 kg (182 lb 8 oz)  BMI 34.50 kg/m2  SpO2 100%      FHT:  FHR: 130 bpm, variability: moderate,  accelerations:  Present,  decelerations:  Present early and variable UC:   regular, every 1-2 minutes SVE:   Dilation: 9 Effacement (%): 100 Station: -1 Exam by:: Elana Alm RNC  Labs: Lab Results  Component Value Date   WBC 5.9 12/02/2012   HGB 12.2 12/02/2012   HCT 36.4 12/02/2012   MCV 87.7 12/02/2012   PLT 168 12/02/2012    Assessment / Plan: Spontaneous labor, progressing normally  Labor: Progressing normally Fetal Wellbeing:  Category I Pain Control:  Epidural I/D:  positive- on PCN Anticipated MOD:  NSVD  Lauren Wong L 12/02/2012, 9:11 AM

## 2012-12-03 MED ORDER — RHO D IMMUNE GLOBULIN 1500 UNIT/2ML IJ SOLN
300.0000 ug | Freq: Once | INTRAMUSCULAR | Status: AC
  Administered 2012-12-03: 300 ug via INTRAMUSCULAR
  Filled 2012-12-03: qty 2

## 2012-12-03 NOTE — Progress Notes (Signed)
Throughout the night both mom and dad insisted on giving baby formula because they thought that the baby was not getting anything to eat from the breast and so that mom could get some rest. Explained to mom, with the help of the interpreter, that the more she puts baby to the breast the quicker her milk would come in (supply and demand) and that right now in the first 24 hours of life that baby only required between 5-7 ml.  Mom was able to hand express colostrum and gave those drops to baby. Mom did result to giving baby a total of 40 ml of Gerber Good Start between 2 feedings.  Will leave not for lactation in the am to see mom today.

## 2012-12-03 NOTE — Progress Notes (Signed)
Lauren Wong is a 32 year old G30P3013. Post Partum Day #1  Subjective:  Pt complains of 9/10 lower abdominal pain identical to the pain she had a few hours ago. She received 1 tablet of Percocet at [facility] which significantly reduced her pain. She quantifies her vaginal bleeding as less than her typical period. Tolerating PO intake. Voiding urine. No bowel movement yet but has been passing flatus.  Objective: Blood pressure 113/75, pulse 64, temperature 98.5 F (36.9 C), temperature source Oral, resp. rate 18, height 5\' 1"  (1.549 m), weight 82.781 kg (182 lb 8 oz), SpO2 98.00%, unknown if currently breastfeeding.  Physical Exam:  General: alert, cooperative and appeared uncomfortable. Lochia: appropriate Uterine Fundus: Firm, slightly deviated to right side. DVT Evaluation: No evidence of DVT seen on physical exam. Negative Homan's sign. No cords or calf tenderness. No significant calf/ankle edema.   Recent Labs  12/02/12 0535  HGB 12.2  HCT 36.4    Assessment/Plan:  Manage pain with Percocet GBS Positive; monitor baby for 48 hours post delivery She desires Nexplanon for contraception She does not want her baby boy circumcised. She plans on both breast feeding and supplementing with formula. Reassess tomorrow morning for discharge.    LOS: 1 day   Sherron Monday 12/03/2012, 7:35 AM   I have seen and examined this patient and agree with above documentation in the PA student's note.   Rulon Abide, M.D. [facility] Fellow 12/03/2012 9:42 AM

## 2012-12-03 NOTE — Anesthesia Postprocedure Evaluation (Signed)
Anesthesia Post Note  Patient: Lauren Wong  Procedure(s) Performed: * No procedures listed *  Anesthesia type: Epidural  Patient location: Mother/Baby  Post pain: Pain level controlled  Post assessment: Post-op Vital signs reviewed  Last Vitals:  Filed Vitals:   12/03/12 0500  BP: 113/75  Pulse: 64  Temp: 36.9 C  Resp: 18    Post vital signs: Reviewed  Level of consciousness: awake  Complications: No apparent anesthesia complications

## 2012-12-03 NOTE — Progress Notes (Signed)
UR chart review completed.  

## 2012-12-04 LAB — RH IG WORKUP (INCLUDES ABO/RH)
ABO/RH(D): O NEG
Unit division: 0

## 2012-12-04 MED ORDER — IBUPROFEN 600 MG PO TABS: 600.0000 mg | ORAL_TABLET | Freq: Four times a day (QID) | ORAL | Status: DC | PRN

## 2012-12-04 MED ORDER — DOCUSATE SODIUM 100 MG PO CAPS: 100.0000 mg | ORAL_CAPSULE | Freq: Two times a day (BID) | ORAL | Status: DC | PRN

## 2012-12-04 NOTE — Discharge Summary (Signed)
Obstetric Discharge Summary Reason for Admission: rupture of membranes Prenatal Procedures: none Intrapartum Procedures: VBAC Postpartum Procedures: Rho(D) Ig Complications-Operative and Postpartum: none Hemoglobin  Date Value Range Status  12/02/2012 12.2  12.0 - 15.0 g/dL Final     HCT  Date Value Range Status  12/02/2012 36.4  36.0 - 46.0 % Final    Physical Exam:  General: alert, cooperative and no distress Lochia: appropriate Uterine Fundus: firm DVT Evaluation: No evidence of DVT seen on physical exam. Negative Homan's sign. No cords or calf tenderness. No significant calf/ankle edema.  Discharge Diagnoses: Term Pregnancy-delivered  Discharge Information: Date: 12/04/2012 Activity: unrestricted Diet: routine Medications: Ibuprofen Condition: stable Instructions: refer to practice specific booklet Discharge to: home   Newborn Data: Live born female  Birth Weight: 9 lb 15 oz (4508 g) APGAR: 7, 8  Home with mother and father.  Sherron Monday 12/04/2012, 7:28 AM  I was present for the exam and agree with above.  [place], CNM 12/04/2012 9:05 AM

## 2012-12-05 MED ORDER — LIDOCAINE HCL (PF) 1 % IJ SOLN
INTRAMUSCULAR | Status: DC | PRN
  Administered 2012-12-02 (×2): 5 mL

## 2012-12-05 MED ORDER — FENTANYL 2.5 MCG/ML BUPIVACAINE 1/10 % EPIDURAL INFUSION (WH - ANES)
INTRAMUSCULAR | Status: DC | PRN
  Administered 2012-12-02: 14 mL/h via EPIDURAL

## 2012-12-05 NOTE — H&P (Signed)
Attestation of Attending Supervision of Advanced Practitioner: Evaluation and management procedures were performed by the PA/NP/CNM/OB Fellow under my supervision/collaboration. Chart reviewed and agree with management and plan.  Haeden Hudock V 12/05/2012 11:17 AM   

## 2012-12-05 NOTE — Anesthesia Procedure Notes (Signed)
Epidural Patient location during procedure: OB Start time: 12/02/2012 7:40 AM  Staffing Anesthesiologist: Brayton Caves Performed by: anesthesiologist   Preanesthetic Checklist Completed: patient identified, site marked, surgical consent, pre-op evaluation, timeout performed, IV checked, risks and benefits discussed and monitors and equipment checked  Epidural Patient position: sitting Prep: site prepped and draped and DuraPrep Patient monitoring: continuous pulse ox and blood pressure Approach: midline Injection technique: LOR air  Needle:  Needle type: Tuohy  Needle gauge: 17 G Needle length: 9 cm and 9 Needle insertion depth: 5 cm cm Catheter type: closed end flexible Catheter size: 19 Gauge Catheter at skin depth: 10 cm Test dose: negative  Assessment Events: blood not aspirated, injection not painful, no injection resistance, negative IV test and no paresthesia  Additional Notes Patient identified.  Risk benefits discussed including failed block, incomplete pain control, headache, nerve damage, paralysis, blood pressure changes, nausea, vomiting, reactions to medication both toxic or allergic, and postpartum back pain.  Patient expressed understanding and wished to proceed.  All questions were answered.  Sterile technique used throughout procedure and epidural site dressed with sterile barrier dressing. No paresthesia or other complications noted.The patient did not experience any signs of intravascular injection such as tinnitus or metallic taste in mouth nor signs of intrathecal spread such as rapid motor block. Please see nursing notes for vital signs.

## 2012-12-09 NOTE — Discharge Summary (Signed)
Attestation of Attending Supervision of Advanced Practitioner (CNM/NP): Evaluation and management procedures were performed by the Advanced Practitioner under my supervision and collaboration. I have reviewed the Advanced Practitioner's note and chart, and I agree with the management and plan.  Xavior Niazi H. 11:28 AM

## 2012-12-10 ENCOUNTER — Encounter: Payer: Self-pay | Admitting: Obstetrics & Gynecology

## 2013-01-08 ENCOUNTER — Ambulatory Visit: Payer: Self-pay | Admitting: Obstetrics & Gynecology

## 2013-04-02 ENCOUNTER — Ambulatory Visit (INDEPENDENT_AMBULATORY_CARE_PROVIDER_SITE_OTHER): Payer: Medicaid Other | Admitting: Advanced Practice Midwife

## 2013-04-02 ENCOUNTER — Encounter: Payer: Self-pay | Admitting: Advanced Practice Midwife

## 2013-04-02 VITALS — BP 112/83 | HR 75 | Temp 97.6°F | Ht 60.0 in | Wt 146.4 lb

## 2013-04-02 DIAGNOSIS — Z3009 Encounter for other general counseling and advice on contraception: Secondary | ICD-10-CM

## 2013-04-02 DIAGNOSIS — Z30011 Encounter for initial prescription of contraceptive pills: Secondary | ICD-10-CM

## 2013-04-02 MED ORDER — DROSPIRENONE-ETHINYL ESTRADIOL 3-0.03 MG PO TABS: 1.0000 | ORAL_TABLET | Freq: Every day | ORAL | Status: DC

## 2013-04-02 NOTE — Patient Instructions (Signed)
Oral Contraception Use  Oral contraceptive pills (OCPs) are medicines taken to prevent pregnancy. OCPs work by preventing the ovaries from releasing eggs. The hormones in OCPs also cause the cervical mucus to thicken, preventing the sperm from entering the uterus. The hormones also cause the uterine lining to become thin, not allowing a fertilized egg to attach to the inside of the uterus. OCPs are highly effective when taken exactly as prescribed. However, OCPs do not prevent sexually transmitted diseases (STDs). Safe sex practices, such as using condoms along with an OCP, can help prevent STDs.  Before taking OCPs, you may have a physical exam and Pap test. Your health care provider may also order blood tests if necessary. Your health care provider will make sure you are a good candidate for oral contraception. Discuss with your health care provider the possible side effects of the OCP you may be prescribed. When starting an OCP, it can take 2 to 3 months for the body to adjust to the changes in hormone levels in your body.   HOW TO TAKE ORAL CONTRACEPTIVE PILLS  Your health care provider may advise you on how to start taking the first cycle of OCPs. Otherwise, you can:   · Start on day 1 of your menstrual period. You will not need any backup contraceptive protection with this start time.    · Start on the first Sunday after your menstrual period or the day you get your prescription. In these cases, you will need to use backup contraceptive protection for the first week.    · Start the pill at any time of your cycle. If you take the pill within 5 days of the start of your period, you are protected against pregnancy right away. In this case, you will not need a backup form of birth control. If you start at any other time of your menstrual cycle, you will need to use another form of birth control for 7 days. If your OCP is the type called a minipill, it will protect you from pregnancy after taking it for 2 days (48  hours).  After you have started taking OCPs:   · If you forget to take 1 pill, take it as soon as you remember. Take the next pill at the regular time.    · If you miss 2 or more pills, call your health care provider because different pills have different instructions for missed doses. Use backup birth control until your next menstrual period starts.    · If you use a 28-day pack that contains inactive pills and you miss 1 of the last 7 pills (pills with no hormones), it will not matter. Throw away the rest of the nonhormone pills and start a new pill pack.    No matter which day you start the OCP, you will always start a new pack on that same day of the week. Have an extra pack of OCPs and a backup contraceptive method available in case you miss some pills or lose your OCP pack.   HOME CARE INSTRUCTIONS   · Do not smoke.    · Always use a condom to protect against STDs. OCPs do not protect against STDs.    · Use a calendar to mark your menstrual period days.    · Read the information and directions that came with your OCP. Talk to your health care provider if you have questions.    SEEK MEDICAL CARE IF:   · You develop nausea and vomiting.    · You have abnormal vaginal discharge   or bleeding.    · You develop a rash.    · You miss your menstrual period.    · You are losing your hair.    · You need treatment for mood swings or depression.    · You get dizzy when taking the OCP.    · You develop acne from taking the OCP.    · You become pregnant.    SEEK IMMEDIATE MEDICAL CARE IF:   · You develop chest pain.    · You develop shortness of breath.    · You have an uncontrolled or severe headache.    · You develop numbness or slurred speech.    · You develop visual problems.    · You develop pain, redness, and swelling in the legs.    Document Released: 12/08/2010 Document Revised: 08/21/2012 Document Reviewed: 06/09/2012  Exit[employer]® Patient Information ©2014 [employer].

## 2013-04-02 NOTE — Progress Notes (Signed)
  Subjective:     Lauren Wong is a 33 y.o. female who presents for a postpartum visit. She is 4 months postpartum following a spontaneous vaginal delivery. I have fully reviewed the prenatal and intrapartum course. The delivery was at term. Outcome: spontaneous vaginal delivery. Anesthesia: none. Postpartum course has been uneventful. Baby's course has been uneventful. Baby is feeding by breast. Bleeding no bleeding. Bowel function is normal. Bladder function is normal. Patient is sexually active. Contraception method is OCP (estrogen/progesterone). Postpartum depression screening: negative.  The following portions of the patient's history were reviewed and updated as appropriate: allergies, current medications, past family history, past medical history, past social history, past surgical history and problem list.  Review of Systems Pertinent items are noted in HPI.   Objective:    BP 112/83  Pulse 75  Temp(Src) 97.6 F (36.4 C) (Oral)  Ht 5' (1.524 m)  Wt 66.407 kg (146 lb 6.4 oz)  BMI 28.59 kg/m2  Breastfeeding? Yes  General:  alert, cooperative and no distress   Breasts:  inspection negative, no nipple discharge or bleeding, no masses or nodularity palpable  Lungs: clear to auscultation bilaterally  Heart:  regular rate and rhythm, S1, S2 normal, no murmur, click, rub or gallop  Abdomen: soft, non-tender; bowel sounds normal; no masses,  no organomegaly   Vulva:  not evaluated  Vagina: not evaluated  Cervix:  n/a  Corpus: not examined  Adnexa:  not evaluated  Rectal Exam: Not performed.        Assessment:     Normal postpartum exam. Pap smear not done at today's visit.   Plan:    1. Contraception: OCP (estrogen/progesterone) 2. Wants to use Yasmin. Explained effect on breast milk production, pt states she plans to wean soon anyway 3. Follow up in: 1 year or as needed.

## 2013-04-08 ENCOUNTER — Telehealth: Payer: Self-pay | Admitting: *Deleted

## 2013-04-08 DIAGNOSIS — Z3049 Encounter for surveillance of other contraceptives: Secondary | ICD-10-CM

## 2013-04-08 NOTE — Telephone Encounter (Signed)
Lauren EvertYuridia called and states she was precribed ocella for birth control and it is too expensive- costs $50 per pack - wants something ordered that is less expensive

## 2013-04-09 NOTE — Telephone Encounter (Signed)
Called patient, no answer, left message that we are attempting to call her.  Please call back and leave message if she needs a [place] interpreter.

## 2013-04-15 MED ORDER — NORGESTIMATE-ETH ESTRADIOL 0.25-35 MG-MCG PO TABS: 1.0000 | ORAL_TABLET | Freq: Every day | ORAL | Status: AC

## 2013-04-15 NOTE — Telephone Encounter (Signed)
ZOXWRUEWalidah gave verbal order for Sprintec. This is on [facility] list.

## 2013-04-15 NOTE — Telephone Encounter (Signed)
Called patient and informed her to pick up new rx at [facility].

## 2013-11-03 ENCOUNTER — Encounter: Payer: Self-pay | Admitting: Advanced Practice Midwife

## 2017-02-08 ENCOUNTER — Other Ambulatory Visit: Payer: Self-pay

## 2017-02-08 ENCOUNTER — Encounter (HOSPITAL_COMMUNITY): Payer: Self-pay | Admitting: Emergency Medicine

## 2017-02-08 ENCOUNTER — Emergency Department (HOSPITAL_COMMUNITY)
Admission: EM | Admit: 2017-02-08 | Discharge: 2017-02-08 | Disposition: A | Discharge status: Home / Self Care | Payer: Self-pay | Attending: Emergency Medicine | Admitting: Emergency Medicine

## 2017-02-08 ENCOUNTER — Emergency Department (HOSPITAL_COMMUNITY): Payer: Self-pay

## 2017-02-08 DIAGNOSIS — M546 Pain in thoracic spine: Secondary | ICD-10-CM | POA: Insufficient documentation

## 2017-02-08 DIAGNOSIS — M25511 Pain in right shoulder: Secondary | ICD-10-CM | POA: Insufficient documentation

## 2017-02-08 DIAGNOSIS — R202 Paresthesia of skin: Secondary | ICD-10-CM | POA: Insufficient documentation

## 2017-02-08 MED ORDER — CYCLOBENZAPRINE HCL 5 MG PO TABS: 5.0000 mg | ORAL_TABLET | Freq: Every evening | ORAL | 0 refills | Status: AC | PRN

## 2017-02-08 MED ORDER — ACETAMINOPHEN 500 MG PO TABS: 1000.0000 mg | ORAL_TABLET | Freq: Three times a day (TID) | ORAL | 0 refills | Status: AC | PRN

## 2017-02-08 MED ORDER — PREDNISONE 10 MG (21) PO TBPK: ORAL_TABLET | Freq: Every day | ORAL | 0 refills | Status: AC

## 2017-02-08 NOTE — ED Notes (Signed)
Discharge vitals in and pts getting dressed. 

## 2017-02-08 NOTE — ED Provider Notes (Signed)
[facility]   CSN: [id number] Arrival date & time: 02/08/17  1242     History   Chief Complaint Chief Complaint  Patient presents with  . Shoulder Pain    HPI Lauren Wong is a 37 y.o. female presenting for evaluation of R arm pain and tingling.   Pt states that for the past week, she has had R arm pain that begins in her back/shoulder and shoots down her arm. Initially it was intermittent, but it is now constant. She has associated L hand tingling. She denies fall, trauma, or injury. Pain is worse with movement off the arm and head. 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 She initially had improved with ibuprofen, but it has not helped recently. She has no other medical problems. She denies pain of the L side. She denies h/o shoulder, neck, or back pain.  HPI  History reviewed. No pertinent past medical history.  Patient Active Problem List   Diagnosis Date Noted  . Oral contraceptive prescribed 04/02/2013  . Leakage of amniotic fluid 12/02/2012  . Pregnancy 12/02/2012    Past Surgical History:  Procedure Laterality Date  . CESAREAN SECTION      OB History    Gravida Para Term Preterm AB Living   4 3 3   1 3    SAB TAB Ectopic Multiple Live Births   1       3       Home Medications    Prior to Admission medications   Medication Sig Start Date End Date Taking? Authorizing Provider  acetaminophen (TYLENOL) 500 MG tablet Take 2 tablets (1,000 mg total) by mouth every 8 (eight) hours as needed. 02/08/17   Kamon Fahr, PA-C  cyclobenzaprine (FLEXERIL) 5 MG tablet Take 1 tablet (5 mg total) by mouth at bedtime as needed for muscle spasms. 02/08/17   Khian Remo, PA-C  norgestimate-ethinyl estradiol (ORTHO-CYCLEN,SPRINTEC,PREVIFEM) 0.25-35 MG-MCG tablet Take 1 tablet by mouth daily. 04/15/13   Marlis EdelsonKarim, Walidah N, CNM    predniSONE (STERAPRED UNI-PAK 21 TAB) 10 MG (21) TBPK tablet Take by mouth daily. Take 6 tabs by mouth daily  for 2 days, then 5 tabs for 2 days, then 4 tabs for 2 days, then 3 tabs for 2 days, 2 tabs for 2 days, then 1 tab by mouth daily for 2 days 02/08/17   Ismelda Weatherman, PA-C    Family History No family history on file.  Social History Social History   Tobacco Use  . Smoking status: Never Smoker  . Smokeless tobacco: Never Used  Substance Use Topics  . Alcohol use: Yes    Comment: occass  . Drug use: No     Allergies   Patient has no known allergies.   Review of Systems Review of Systems  Musculoskeletal: Positive for arthralgias, back pain, myalgias and neck pain.  Skin: Negative for wound.  Neurological: Negative for dizziness and headaches.       R arm tignling  Hematological: Does not bruise/bleed easily.     Physical Exam Updated Vital Signs BP 118/88   Pulse 78   Temp 98.3 F (36.8 C) (Oral)   Resp 16   SpO2 100%   Physical Exam  Constitutional: She is oriented to person, place, and time. She appears well-developed and well-nourished. No distress.  HENT:  Head: Normocephalic and atraumatic.  Eyes: EOM  are normal.  Neck: Normal range of motion.  Pulmonary/Chest: Effort normal.  Abdominal: She exhibits no distension.  Musculoskeletal: Normal range of motion. She exhibits tenderness.  TTP of R sided neck and upper back musculature. Increased pain in muscles between spine and R shoulder blade. Full active ROM of BUE. Full active ROM of neck. Grip strength intact bilaterally, slightly decreased on R. radial pulses equal bilaterally. Sensation intact bilaterally. No obvious deformity, injury, or laceration.   Neurological: She is alert and oriented to person, place, and time. She has normal strength. No sensory deficit.  Skin: Skin is warm. No rash noted.  Psychiatric: She has a normal mood and affect.  Nursing note and vitals reviewed.    ED  Treatments / Results  Labs (all labs ordered are listed, but only abnormal results are displayed) Labs Reviewed - No data to display  EKG  EKG Interpretation None       Radiology Dg Shoulder Right  Result Date: 02/08/2017 CLINICAL DATA:  Right upper extremity weakness and pain. EXAM: RIGHT SHOULDER - 2+ VIEW COMPARISON:  None. FINDINGS: There is no evidence of fracture or dislocation. There is no evidence of arthropathy or other focal bone abnormality. Soft tissues are unremarkable. IMPRESSION: Negative. Electronically Signed   By: Obie Dredge M.D.   On: 02/08/2017 14:16    Procedures Procedures (including critical care time)  Medications Ordered in ED Medications - No data to display   Initial Impression / Assessment and Plan / ED Course  I have reviewed the triage vital signs and the nursing notes.  Pertinent labs & imaging results that were available during my care of the patient were reviewed by me and considered in my medical decision making (see chart for details).     Patient presenting for 1 week history of right shoulder/arm pain.  Physical exam reassuring, patient is neurovascularly intact.  X-ray negative for fracture or dislocation.  Likely nerve irritation due to muscular irritation/overuse.  Discussed findings with patient.  Discussed treatment with prednisone, muscle relaxer, and Tylenol.  Patient to follow-up with orthopedics if symptoms do not improve (?need for MRI).  At this time, patient appears safe for discharge.  Return precautions given.  Patient states she understands and agrees to plan.   Final Clinical Impressions(s) / ED Diagnoses   Final diagnoses:  Acute right-sided thoracic back pain    ED Discharge Orders        Ordered    predniSONE (STERAPRED UNI-PAK 21 TAB) 10 MG (21) TBPK tablet  Daily     02/08/17 1804    acetaminophen (TYLENOL) 500 MG tablet  Every 8 hours PRN     02/08/17 1804    cyclobenzaprine (FLEXERIL) 5 MG tablet  At  bedtime PRN     02/08/17 1804       Alveria Apley, PA-C 02/08/17 1934    Pricilla Loveless, MD 02/09/17 2220

## 2017-02-08 NOTE — Discharge Instructions (Signed)
Toma tylenol 3 veces cada dia por dolor.  Toma prednisone por inflamacion.  Toma flexeril para relajar los musculos.  Da Neomia Dearuna cita con el doctor ortopedica si los sintomas no mejoran.  Regresa a la sala de emergencia si no puede mover el brazo, si el dolor peoran, o con nuevas sintomas.   Take tylenol 3 times a day with meals.   Take prednisone as prescribed.  Use Flexeril as needed for muscle stiffness or soreness.  Have caution, this may make you tired or groggy.  Do not drive or operate heavy machinery while taking this medicine. Follow up with orthopedics if symptoms do not improve.  Return to the emergency room if you develop inability to move you arm, worsening pain, or any new or concerning symptoms.

## 2017-02-08 NOTE — ED Triage Notes (Signed)
Pt reports R shoulder pain x 1 week with decreased sensation to RUE and decreased grip strength.  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 Pulses intact, no other neuro deficits noted. Pt denies neck pain.

## 2022-07-04 ENCOUNTER — Encounter (HOSPITAL_COMMUNITY): Payer: Self-pay

## 2022-07-04 ENCOUNTER — Ambulatory Visit (HOSPITAL_COMMUNITY)
Admission: EM | Admit: 2022-07-04 | Discharge: 2022-07-04 | Disposition: A | Discharge status: Home / Self Care | Payer: Self-pay | Attending: Emergency Medicine | Admitting: Emergency Medicine

## 2022-07-04 DIAGNOSIS — H6502 Acute serous otitis media, left ear: Secondary | ICD-10-CM

## 2022-07-04 MED ORDER — AMOXICILLIN 500 MG PO CAPS: 500.0000 mg | ORAL_CAPSULE | Freq: Two times a day (BID) | ORAL | 0 refills | Status: AC

## 2022-07-04 MED ORDER — IBUPROFEN 800 MG PO TABS: 800.0000 mg | ORAL_TABLET | Freq: Three times a day (TID) | ORAL | 0 refills | Status: AC

## 2022-07-04 NOTE — ED Provider Notes (Signed)
[facility]    CSN: [id number] Arrival date & time: 07/04/22  1628      History   Chief Complaint Chief Complaint  Patient presents with   Otalgia    HPI Lauren Wong is a 42 y.o. female.   Patient presents for evaluation of left-sided ear pain present for 2 weeks, pain described as an intense burning sensation.  Associated fullness to the ear. has attempted use of Tylenol which has been minimally effective. Denies recent swimming, injury or trauma.  Denies fever congestion sore throat drainage.    History reviewed. No pertinent past medical history.  Patient Active Problem List   Diagnosis Date Noted   Oral contraceptive prescribed 04/02/2013   Leakage of amniotic fluid 12/02/2012   Pregnancy 12/02/2012    Past Surgical History:  Procedure Laterality Date   CESAREAN SECTION      OB History     Gravida  4   Para  3   Term  3   Preterm      AB  1   Living  3      SAB  1   IAB      Ectopic      Multiple      Live Births  3            Home Medications    Prior to Admission medications   Medication Sig Start Date End Date Taking? Authorizing Provider  acetaminophen (TYLENOL) 500 MG tablet Take 2 tablets (1,000 mg total) by mouth every 8 (eight) hours as needed. 02/08/17   Caccavale, Sophia, PA-C  cyclobenzaprine (FLEXERIL) 5 MG tablet Take 1 tablet (5 mg total) by mouth at bedtime as needed for muscle spasms. 02/08/17   Caccavale, Sophia, PA-C  norgestimate-ethinyl estradiol (ORTHO-CYCLEN,SPRINTEC,PREVIFEM) 0.25-35 MG-MCG tablet Take 1 tablet by mouth daily. 04/15/13   Karim-Rhoades, Kae Heller, CNM  predniSONE (STERAPRED UNI-PAK 21 TAB) 10 MG (21) TBPK tablet Take by mouth daily. Take 6 tabs by mouth daily  for 2 days, then 5 tabs for 2 days, then 4 tabs for 2 days, then 3 tabs for 2 days, 2 tabs for 2 days, then 1 tab by mouth daily for 2 days 02/08/17   Caccavale, Sophia, PA-C    Family History History reviewed. No pertinent  family history.  Social History Social History   Tobacco Use   Smoking status: Never   Smokeless tobacco: Never  Vaping Use   Vaping Use: Never used  Substance Use Topics   Alcohol use: Yes    Comment: occass   Drug use: No     Allergies   Patient has no known allergies.   Review of Systems Review of Systems  HENT:  Positive for ear pain.      Physical Exam Triage Vital Signs ED Triage Vitals  Enc Vitals Group     BP 07/04/22 1647 137/82     Pulse Rate 07/04/22 1647 (!) 2     Resp 07/04/22 1647 16     Temp 07/04/22 1647 98.1 F (36.7 C)     Temp Source 07/04/22 1647 Oral     SpO2 07/04/22 1647 97 %     Weight --      Height --      Head Circumference --      Peak Flow --      Pain Score 07/04/22 1645 7     Pain Loc --      Pain Edu? --  Excl. in GC? --    No data found.  Updated Vital Signs BP 137/82 (BP Location: Left Arm)   Pulse (!) 2   Temp 98.1 F (36.7 C) (Oral)   Resp 16   SpO2 97%   Visual Acuity Right Eye Distance:   Left Eye Distance:   Bilateral Distance:    Right Eye Near:   Left Eye Near:    Bilateral Near:     Physical Exam Constitutional:      Appearance: Normal appearance.  HENT:     Right Ear: Hearing, tympanic membrane, ear canal and external ear normal.     Left Ear: Hearing, ear canal and external ear normal. Tympanic membrane is erythematous.  Eyes:     Extraocular Movements: Extraocular movements intact.  Pulmonary:     Effort: Pulmonary effort is normal.  Neurological:     Mental Status: She is alert and oriented to person, place, and time. Mental status is at baseline.      UC Treatments / Results  Labs (all labs ordered are listed, but only abnormal results are displayed) Labs Reviewed - No data to display  EKG   Radiology No results found.  Procedures Procedures (including critical care time)  Medications Ordered in UC Medications - No data to display  Initial Impression / Assessment and  Plan / UC Course  I have reviewed the triage vital signs and the nursing notes.  Pertinent labs & imaging results that were available during my care of the patient were reviewed by me and considered in my medical decision making (see chart for details).  Acute nonrecurrent serous otitis media of left ear  Erythema present to the tympanic membrane on exam, discussed findings with patient via interpreter, prescribed amoxicillin and ibuprofen 800 mg, discussed administration, advised against ear cleaning while symptoms are present, given precautions to follow-up if symptoms persist or worsen Final Clinical Impressions(s) / UC Diagnoses   Final diagnoses:  None   Discharge Instructions   None    ED Prescriptions   None    PDMP not reviewed this encounter.   Valinda Hoar, NP 07/04/22 1728

## 2022-07-04 NOTE — Discharge Instructions (Addendum)
Hoy ests siendo tratado por una infeccin en el tmpano.  Tome amoxicilina dos veces al da durante 10 das, debera comenzar a ver mejora despus de 48 horas de uso del medicamento y luego debera mejorar progresivamente.  Puede usar Tylenol o ibuprofeno para controlar el malestar.  Puede colocar compresas tibias en el odo para mayor comodidad.  No intente limpiar los odos ni colocar objetos o lquidos en el canal auditivo para evitar una mayor irritacin.   Today you are being treated for an infection of the eardrum  Take amoxicillin twice daily for 10 days, you should begin to see improvement after 48 hours of medication use and then it should progressively get better  You may use Tylenol or ibuprofen for management of discomfort  May hold warm compresses to the ear for additional comfort  Please not attempted any ear cleaning or object or fluid placement into the ear canal to prevent further irritation  

## 2022-07-04 NOTE — ED Triage Notes (Signed)
Pt states via interpretor having left ear pain for the past 2 weeks worse in the past few days. Has been  taking Tylenol at home for the pain.

## 2022-10-19 ENCOUNTER — Telehealth: Payer: Self-pay

## 2022-10-19 NOTE — Telephone Encounter (Signed)
Telephoned patient at mobile number using interpreter#[phone]. Left voice message with [facility] (scholarship) contact information.
